# Patient Record
Sex: Male | Born: 2005 | Race: White | Hispanic: Yes | Marital: Single | Smoking: Current some day smoker
Health system: Southern US, Community
[De-identification: ages and names within clinical notes are randomized; demographics above are authoritative.]

## PROBLEM LIST (undated history)

## (undated) HISTORY — PX: FRACTURE SURGERY: SHX138

---
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
## 2024-03-13 ENCOUNTER — Encounter: Payer: Self-pay | Admitting: Advanced Practice Midwife

## 2024-05-08 ENCOUNTER — Encounter: Payer: Self-pay | Admitting: Medical

## 2024-05-08 ENCOUNTER — Telehealth: Payer: Self-pay | Admitting: Medical

## 2024-05-08 ENCOUNTER — Other Ambulatory Visit (HOSPITAL_BASED_OUTPATIENT_CLINIC_OR_DEPARTMENT_OTHER): Payer: Self-pay

## 2024-05-08 ENCOUNTER — Ambulatory Visit (INDEPENDENT_AMBULATORY_CARE_PROVIDER_SITE_OTHER): Admitting: Medical

## 2024-05-08 VITALS — BP 115/70 | HR 51 | Temp 97.8°F | Resp 14 | Ht 66.0 in | Wt 178.6 lb

## 2024-05-08 DIAGNOSIS — M79671 Pain in right foot: Secondary | ICD-10-CM | POA: Diagnosis not present

## 2024-05-08 DIAGNOSIS — R1010 Upper abdominal pain, unspecified: Secondary | ICD-10-CM

## 2024-05-08 DIAGNOSIS — M79672 Pain in left foot: Secondary | ICD-10-CM | POA: Diagnosis not present

## 2024-05-08 DIAGNOSIS — G4489 Other headache syndrome: Secondary | ICD-10-CM | POA: Diagnosis not present

## 2024-05-08 DIAGNOSIS — G8929 Other chronic pain: Secondary | ICD-10-CM | POA: Diagnosis not present

## 2024-05-08 MED ORDER — FAMOTIDINE 20 MG PO TABS
20.0000 mg | ORAL_TABLET | Freq: Every day | ORAL | 0 refills | Status: AC
  Filled 2024-05-08: qty 30, 30d supply, fill #0

## 2024-05-08 NOTE — Patient Instructions (Addendum)
 Bilateral foot pain Persistent low-level bilateral foot pain, likely due to flat foot. - Recommend orthotic support. - Refer to podiatrist in 2-2.5 weeks. - Advise daily use of Dr. Heriberto heel pads. - Educated on plantar fasciitis with printed material. - Instruct to monitor pain improvement and decide on podiatrist appointment based on symptom resolution.  Chronic left upper quadrant abdominal pain Chronic dull left upper quadrant pain, possibly atypical heartburn or reflux. - Prescribe famotidine  20 mg daily for 30 days. - Advise dietary modifications: avoid fried, greasy foods, fruit juices, decrease caffeine. - Order abdominal ultrasound. - Discuss potential further evaluation if symptoms persist.  Recurrent headaches Recurrent low-level headaches, possibly tension-type or vision-related. - Advise maintaining hydration. - Recommend trial of Tylenol  or ibuprofen . - Monitor headache frequency, duration, response, and symptoms. - Consider optometrist referral if vision issues persist. vision 20/15 today each eye and separate  - Schedule follow-up in 3 weeks with pcp to reassess and consider imaging if persistent.  Plantar Fasciitis: What to Know  Your plantar fascia is a band of thick tissue on the bottom of your foot. It connects your heel bone to the base of your toes. If the fascia gets irritated, it can cause pain in your heel or foot. This is called plantar fasciitis. In some cases, plantar fasciitis can make it hard for you to walk or move. The pain is often worse in the morning after sleeping, or after sitting or lying down for a long time. Pain may also be worse after walking or standing for a long time. What are the causes? Plantar fasciitis may be caused by: Standing for a long time. Wearing shoes that don't have good arch support. Doing high-impact activities. These are things that put stress on your joints. They include: Ballet. Aerobic exercises. These are exercises  that make your heart beat faster. Being overweight. Having a way of walking, or gait, that isn't normal. Tight muscles in your calf, which is in the back of your lower leg. High arches in your feet, or flat feet. Starting a new sport or activity. What are the signs or symptoms? The main symptom of plantar fasciitis is heel pain. Your pain may get worse after: You take your first steps after a time of rest. This includes in the morning after you wake up, or after you've been sitting or lying down for a while. Standing still for a long time. Pain may lessen after 30-45 minutes of activity, such as gentle walking. How is this diagnosed? Plantar fasciitis may be diagnosed based on your medical history, your symptoms, and an exam. Your health care provider will check for: A tender spot on the bottom of your foot. A high arch in your foot, or flat feet. Pain when you move your foot. Trouble moving your foot. You may also have tests. These may include: X-rays. Ultrasound. MRI. How is this treated? Treatment depends on how bad your plantar fasciitis is. It may include: RICE therapy. This stands for rest, ice, pressure (compression), and raising (elevating) the foot. Exercises to stretch your calves and plantar fascia. A night splint. This holds your foot in a stretched, upward position while you sleep. Physical therapy. This can help with symptoms. It can also prevent problems in the future. Shots of a steroid medicine called cortisone. This can help with pain and irritation. Extracorporeal shock wave therapy. This uses electric shocks to stimulate your plantar fascia. If other treatments don't help, you may need to have surgery. Follow these instructions  at home: Managing pain, stiffness, and swelling  Use ice, an ice pack, or a frozen bottle of water as told. Place a towel between your skin and the ice. Roll the bottom of your foot over the ice or frozen bottle. Do this for 20 minutes,  2-3 times a day. If your skin turns red, take off the ice right away to prevent skin damage. The risk of damage is higher if you can't feel pain, heat, or cold. Wear shoes that have air-sole or gel-sole cushions. You could also try soft shoe inserts made for plantar fasciitis. Activity Try not to do things that cause pain. Ask what things are safe for you to do. Exercise as told. Try activities that are low impact. This means that they're easier on your joints. They include: Swimming. Water aerobics. Biking. General instructions Take your medicines only as told. Wear a night splint as told. Loosen the splint if your toes tingle, are numb, or turn cold and blue. Stay at a healthy weight. Work with your provider to lose weight as needed. Contact a health care provider if: Your symptoms don't go away with treatment. You have pain that gets worse. Your pain makes it hard to move or do everyday things. This information is not intended to replace advice given to you by your health care provider. Make sure you discuss any questions you have with your health care provider. Document Revised: 01/14/2023 Document Reviewed: 01/14/2023 Elsevier Patient Education  2024 [employer].

## 2024-05-08 NOTE — Telephone Encounter (Signed)
 Opened by accident

## 2024-05-08 NOTE — Progress Notes (Signed)
 Subjective:    Patient ID: Roger [name], male    DOB: 22-Aug-2006, 18 y.o.   MRN: [id number]  HPI  Roger [name] is a 18 year old male who presents with foot pain, abdominal pain, and headaches.  He has been experiencing bilateral foot pain for the past six months, primarily located in the middle arch of both feet. The pain is exacerbated by walking and occurs even when not walking extensively. He works in [employer] and wears various types of shoes, including Nikes, but does not use any additional foot padding or cushions. The pain is described as low level but persistent. No accident reported  He experiences abdominal pain, particularly in the morning, which has been ongoing for about a year. The pain is described as dull and constant, located on one side of the abdomen. No nausea, vomiting, black or bloody stools, and he reports weight gain over the past year. His diet includes fried foods, steaks, and fish, and he notes that orange juice exacerbates his stomach discomfort.  He has been experiencing headaches for the past three to four months. The headaches are described as low level but bothersome, occurring sporadically and not daily. No associated nausea, vomiting, or visual changes such as spots or blurred vision, although he mentions difficulty with reading black lettering on white paper. He does not take any medication for the headaches and has not identified any specific triggers, though he notes that sound may sometimes exacerbate the pain   Review of Systems  Constitutional:  Negative for chills, fatigue and fever.  HENT:  Negative for congestion and ear pain.   Respiratory:  Negative for cough, chest tightness and wheezing.   Cardiovascular:  Negative for chest pain and palpitations.  Gastrointestinal:  Positive for abdominal pain. Negative for blood in stool, diarrhea and rectal pain.  Genitourinary:  Negative for dysuria.  Musculoskeletal:  Negative for back  pain, joint swelling and neck stiffness.       Feet pain  Skin:  Negative for rash.  Neurological:  Positive for headaches. Negative for dizziness and speech difficulty.  Hematological:  Negative for adenopathy.  Psychiatric/Behavioral:  Negative for behavioral problems, decreased concentration and suicidal ideas. The patient is not nervous/anxious.     No past medical history on file.   Social History   Socioeconomic History   Marital status: Single    Spouse name: Not on file   Number of children: Not on file   Years of education: Not on file   Highest education level: Not on file  Occupational History   Not on file  Tobacco Use   Smoking status: Some Days    Types: Cigarettes, E-cigarettes   Smokeless tobacco: Never  Vaping Use   Vaping status: Never Used  Substance and Sexual Activity   Alcohol use: Yes    Comment: occasional   Drug use: Never   Sexual activity: Yes    Partners: Female  Other Topics Concern   Not on file  Social History Narrative   Mother, Step father, Sister, mother's cousin   Social Drivers of Health   Financial Resource Strain: Not on file  Food Insecurity: No Food Insecurity (09/19/2018)   Hunger Vital Sign    Worried About Running Out of Food in the Last Year: Never true    Ran Out of Food in the Last Year: Never true  Transportation Needs: No Transportation Needs (12/19/2022)   PRAPARE - Administrator, Civil Service (Medical): No  Lack of Transportation (Non-Medical): No  Physical Activity: Not on file  Stress: Not on file  Social Connections: Not on file  Intimate Partner Violence: Not on file    Past Surgical History:  Procedure Laterality Date   CLOSED REDUCTION HUMERUS FRACTURE  11/27/2011   Procedure: CLOSED REDUCTION HUMERAL SHAFT;  Surgeon: Oneil JAYSON Herald, MD;  Location: [facility] OR;  Service: Orthopedics;  Laterality: Left;   FRACTURE SURGERY      No family history on file.  No Known Allergies  Current Outpatient  Medications on File Prior to Visit  Medication Sig Dispense Refill   ondansetron  (ZOFRAN -ODT) 8 MG disintegrating tablet Take 1 tablet (8 mg total) by mouth every 8 (eight) hours as needed for nausea or vomiting. 20 tablet 0   No current facility-administered medications on file prior to visit.    BP 115/70   Pulse (!) 51   Temp 97.8 F (36.6 C) (Oral)   Resp 14   Ht 5' 6 (1.676 m)   Wt 178 lb 9.6 oz (81 kg)   SpO2 98%   BMI 28.83 kg/m        Objective:   Physical Exam General Mental Status- Alert. General Appearance- Not in acute distress.   Skin General: Color- Normal Color. Moisture- Normal Moisture.  Neck Carotid Arteries- Normal color. Moisture- Normal Moisture. No carotid bruits. No JVD.  Chest and Lung Exam Auscultation: Breath Sounds:-Normal.  Cardiovascular Auscultation:Rythm- Regular. Murmurs & Other Heart Sounds:Auscultation of the heart reveals- No Murmurs.  Abdomen Inspection:-Inspeection Normal. Palpation/Percussion:Note:No mass. Palpation and Percussion of the abdomen reveal- mild-moderate luq Tender, Non Distended + BS, no rebound or guarding.    Neurologic Cranial Nerve exam:- CN III-XII intact(No nystagmus), symmetric smile. Drift Test:- No drift. Romberg Exam:- Negative.  Heal to Toe Gait exam:-Normal. Finger to Nose:- Normal/Intact Strength:- 5/5 equal and symmetric strength both upper and lower extremities.   Feet- bilateral mid aspect bottom of feet pain on palpation.    Assessment & Plan:   Bilateral foot pain Persistent low-level bilateral foot pain, likely due to flat foot. - Recommend orthotic support. - Refer to podiatrist in 2-2.5 weeks. - Advise daily use of Dr. Heriberto heel pads. - Educated on plantar fasciitis with printed material. - Instruct to monitor pain improvement and decide on podiatrist appointment based on symptom resolution.  Chronic left upper quadrant abdominal pain Chronic dull left upper quadrant pain,  possibly atypical heartburn or reflux. - Prescribe famotidine  20 mg daily for 30 days. - Advise dietary modifications: avoid fried, greasy foods, fruit juices, decrease caffeine. - Order abdominal ultrasound. - Discuss potential further evaluation if symptoms persist.  Recurrent headaches Recurrent low-level headaches, possibly tension-type or vision-related. - Advise maintaining hydration. - Recommend trial of Tylenol  or ibuprofen . - Monitor headache frequency, duration, response, and symptoms. - Consider optometrist referral if vision issues persist. vision 20/15 today each eye and separate  - Schedule follow-up in 3 weeks with pcp to reassess and consider imaging if persistent.

## 2024-05-20 ENCOUNTER — Other Ambulatory Visit (HOSPITAL_BASED_OUTPATIENT_CLINIC_OR_DEPARTMENT_OTHER): Payer: Self-pay

## 2024-05-25 ENCOUNTER — Ambulatory Visit

## 2024-05-25 ENCOUNTER — Ambulatory Visit: Admitting: Podiatry

## 2024-05-25 ENCOUNTER — Encounter: Payer: Self-pay | Admitting: Podiatry

## 2024-05-25 DIAGNOSIS — M7751 Other enthesopathy of right foot: Secondary | ICD-10-CM | POA: Diagnosis not present

## 2024-05-25 DIAGNOSIS — M7752 Other enthesopathy of left foot: Secondary | ICD-10-CM | POA: Diagnosis not present

## 2024-05-25 DIAGNOSIS — M216X2 Other acquired deformities of left foot: Secondary | ICD-10-CM | POA: Diagnosis not present

## 2024-05-25 DIAGNOSIS — M216X1 Other acquired deformities of right foot: Secondary | ICD-10-CM

## 2024-05-25 NOTE — Progress Notes (Signed)
   Chief Complaint  Patient presents with   Foot Pain    Pt is here due to bilateral foot pain, states this has been going on for a few months, no injuries, states the pain is at the arch of both feet.    Subjective:  18 y.o. male presenting today as a new patient for evaluation of chronic bilateral foot pain ongoing for several years.  No history of injury.  Pain depending on activity.   No past medical history on file.     Objective/Physical Exam General: The patient is alert and oriented x3 in no acute distress.  Dermatology: Skin is warm, dry and supple bilateral lower extremities. Negative for open lesions or macerations.  Vascular: Palpable pedal pulses bilaterally. No edema or erythema noted. Capillary refill within normal limits.  Neurological: Grossly intact via light touch  Musculoskeletal Exam: Range of motion within normal limits to all pedal and ankle joints bilateral. Muscle strength 5/5 in all groups bilateral.  Upon weightbearing there is a medial longitudinal arch collapse bilaterally. Remove foot valgus noted to the bilateral lower extremities with excessive pronation upon mid stance.  Radiographic Exam B/L feet 05/25/2024:  Normal osseous mineralization. Joint spaces preserved. No fracture/dislocation/boney destruction.   Pes planus noted on radiographic exam lateral views. Decreased calcaneal inclination and metatarsal declination angle is noted. Anterior break in the cyma line noted on lateral views. Medial talar head to deviation noted on AP radiograph.   Assessment: 1. pes planus bilateral  Other acquired deformities of left foot - Plan: DG Foot Complete Left, DG Foot Complete Right  Other acquired deformities of right foot    Plan of Care:  -Patient was evaluated. X-Rays reviewed.  - today the patient was molded for custom orthotics to support the medial longitudinal arch of the foot and potentially alleviate a lot of the patient's symptoms and chronic  foot pain -Advised against going barefoot.  Recommend good supportive tennis shoes and sneakers -Return to clinic for orthotics pickup   Thresa EMERSON Sar, DPM [facility]  Dr. Thresa EMERSON Sar, DPM    [address]                                        [place], [place] [address]                Office [phone]  Fax [phone]

## 2024-05-27 ENCOUNTER — Telehealth (HOSPITAL_BASED_OUTPATIENT_CLINIC_OR_DEPARTMENT_OTHER): Payer: Self-pay

## 2024-06-12 ENCOUNTER — Telehealth: Payer: Self-pay

## 2024-06-12 NOTE — Telephone Encounter (Signed)
 Orthotics are here Charges pending insurance No financial form signed and on file Appt needed

## 2024-06-24 ENCOUNTER — Telehealth (HOSPITAL_BASED_OUTPATIENT_CLINIC_OR_DEPARTMENT_OTHER): Payer: Self-pay

## 2024-06-29 ENCOUNTER — Other Ambulatory Visit: Payer: Self-pay
